# Patient Record
Sex: Female | Born: 1980
Health system: Southern US, Community
[De-identification: ages and names within clinical notes are randomized; demographics above are authoritative.]

## PROBLEM LIST (undated history)

## (undated) DIAGNOSIS — Z8632 Personal history of gestational diabetes: Secondary | ICD-10-CM

## (undated) HISTORY — DX: Personal history of gestational diabetes: Z86.32

---

## 2003-05-29 ENCOUNTER — Other Ambulatory Visit: Admission: RE | Admit: 2003-05-29 | Discharge: 2003-05-29 | Payer: Self-pay | Admitting: Obstetrics and Gynecology

## 2003-09-25 ENCOUNTER — Encounter: Admission: RE | Admit: 2003-09-25 | Discharge: 2003-09-25 | Payer: Self-pay | Admitting: Obstetrics and Gynecology

## 2003-12-12 ENCOUNTER — Inpatient Hospital Stay (HOSPITAL_COMMUNITY): Admission: AD | Admit: 2003-12-12 | Discharge: 2003-12-16 | Payer: Self-pay | Admitting: Obstetrics & Gynecology

## 2013-08-25 ENCOUNTER — Ambulatory Visit: Payer: Self-pay

## 2013-10-10 ENCOUNTER — Ambulatory Visit: Payer: Self-pay | Admitting: Nurse Practitioner

## 2013-11-04 ENCOUNTER — Ambulatory Visit: Payer: Self-pay | Admitting: Nurse Practitioner

## 2013-11-27 ENCOUNTER — Ambulatory Visit: Payer: Self-pay | Admitting: Obstetrics and Gynecology

## 2013-11-27 LAB — CBC WITH DIFFERENTIAL/PLATELET
BASOS ABS: 0 10*3/uL (ref 0.0–0.1)
Basophil %: 0.3 %
EOS ABS: 0.2 10*3/uL (ref 0.0–0.7)
Eosinophil %: 1.5 %
HCT: 40.7 % (ref 35.0–47.0)
HGB: 13.6 g/dL (ref 12.0–16.0)
Lymphocyte #: 1.9 10*3/uL (ref 1.0–3.6)
Lymphocyte %: 17 %
MCH: 31.4 pg (ref 26.0–34.0)
MCHC: 33.5 g/dL (ref 32.0–36.0)
MCV: 94 fL (ref 80–100)
MONO ABS: 0.7 x10 3/mm (ref 0.2–0.9)
MONOS PCT: 6.3 %
NEUTROS ABS: 8.1 10*3/uL — AB (ref 1.4–6.5)
Neutrophil %: 74.9 %
Platelet: 327 10*3/uL (ref 150–440)
RBC: 4.33 10*6/uL (ref 3.80–5.20)
RDW: 13.7 % (ref 11.5–14.5)
WBC: 10.9 10*3/uL (ref 3.6–11.0)

## 2013-11-28 ENCOUNTER — Inpatient Hospital Stay: Payer: Self-pay | Admitting: Obstetrics and Gynecology

## 2013-11-29 LAB — HEMATOCRIT: HCT: 37.7 % (ref 35.0–47.0)

## 2013-12-02 ENCOUNTER — Emergency Department: Payer: Self-pay | Admitting: Emergency Medicine

## 2014-07-28 NOTE — Op Note (Signed)
PATIENT NAME:  Alexandria Peterson, Alexandria Peterson MR#:  [id number] DATE OF BIRTH:  12/16/1980  DATE OF PROCEDURE:  11/28/2013  PROCEDURE: Secondary (Dictation Anomaly) lut  C-section.   PREOPERATIVE DIAGNOSIS: Repeat C-section.  POSTOPERATIVE DIAGNOSIS: Repeat C-section.    ESTIMATED BLOOD LOSS: 1000 mL.   FINDINGS: Term live born infant with normal uterus, tubes and ovaries.   DESCRIPTION OF PROCEDURE: The patient was taken to the operating room and placed in supine position after adequate general spinal anesthesia was instilled. The patient was prepped and draped in the usual sterile fashion. The previous skin incision had been marked with a marking pen. Incision was then extended down to the fascia with a knife, nicked in the midline, and the incision was extended in a superolateral manner. Kochers were placed on the rectus fascia and the fascia and muscles were bluntly and sharply dissected off each other midline. Muscle bellies was identified. The peritoneum was grasped and sharply entered. Bladder blade was placed. A bladder flap was created. Uterine incision was made. The infant's head was delivered. Cord was clamped and cut and the infant was handed to the awaiting pediatrician. Cord blood was obtained. Pitocin was started.  The uterus was exteriorized and wrapped in a moist laparotomy sponge. The interior of the uterus was curetted with a moist lap sponge. Running locked chromic and a running imbricating chromic suture were then performed on the uterine incision and 3-0 chromic was used to tack the bladder back up to the uterine incision. The abdomen was cleared of clots. The uterus was placed back into the abdomen. The gutters were cleared of clots. Muscle bellies were approximated with a running Vicryl suture. On-Q trocars were placed through the skin, subcutaneous fat and fascia. The catheters were then wrapped between the muscle and the fascia. The fascia was closed with a running Vicryl suture. An XLH was  used to approximate the subcutaneous fat. 4-0 Monocryl was placed to approximate the skin edges. Dermabond was placed on the trocar port site. A 4 x 4 and Tegaderm was placed over this. The benzoin and Steri-Strips were placed over the lower skin incision. Clear urine was noted in the Foley bag and the patient was taken to recovery after having tolerated the procedure well.   ____________________________ Elliot Gurneyarrie C. Jin Capote, MD cck:JT D: 11/29/2013 23:41:26 ET T: 11/30/2013 06:05:21 ET JOB#: [id number]  cc: Elliot Gurneyarrie C. Lavar Rosenzweig, MD, <Dictator> Elliot GurneyARRIE C Tzion Wedel MD ELECTRONICALLY SIGNED 12/12/2013 15:06

## 2015-02-04 IMAGING — US US EXTREM LOW VENOUS BILAT
1 series · 13 of 24 positions shown · non-contrast
Comparison: None.

CLINICAL DATA: Lower extremity pain and swelling



[Series 1: us extrem low venous bilat · 0.08mm/px · 13 of 71 slices shown]
[im 1/71]
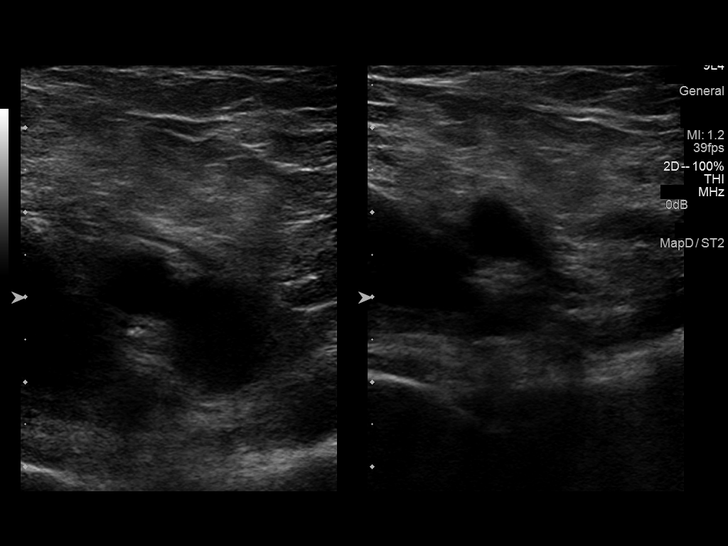
[im 7/71]
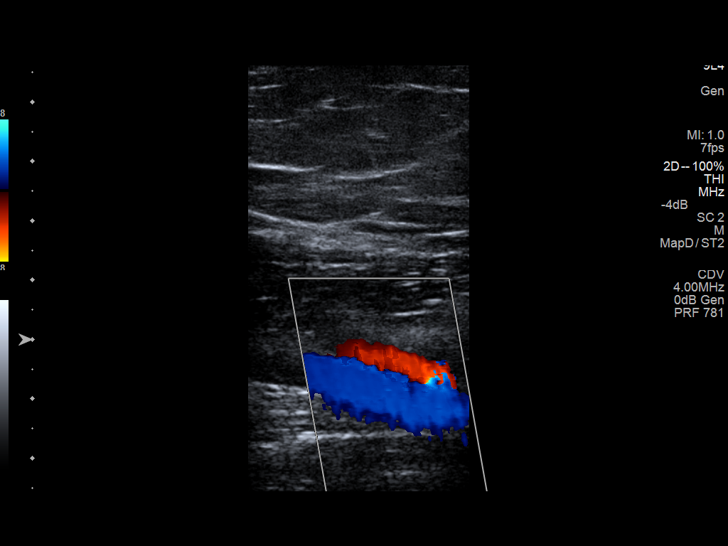
[im 13/71]
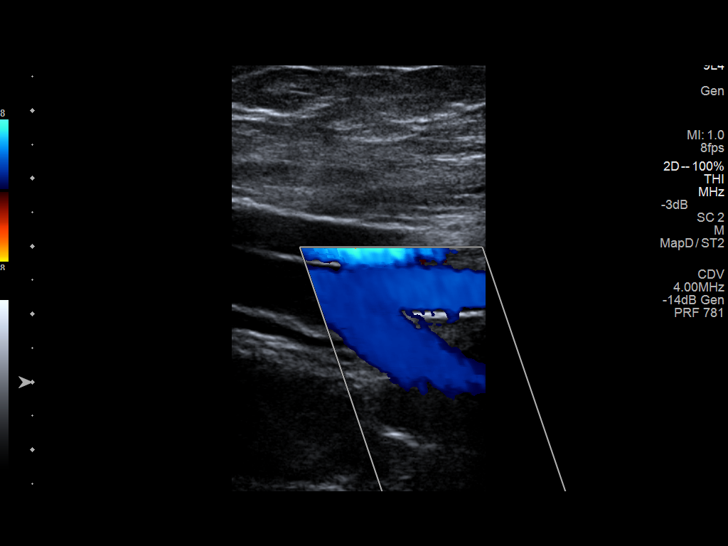
[im 19/71]
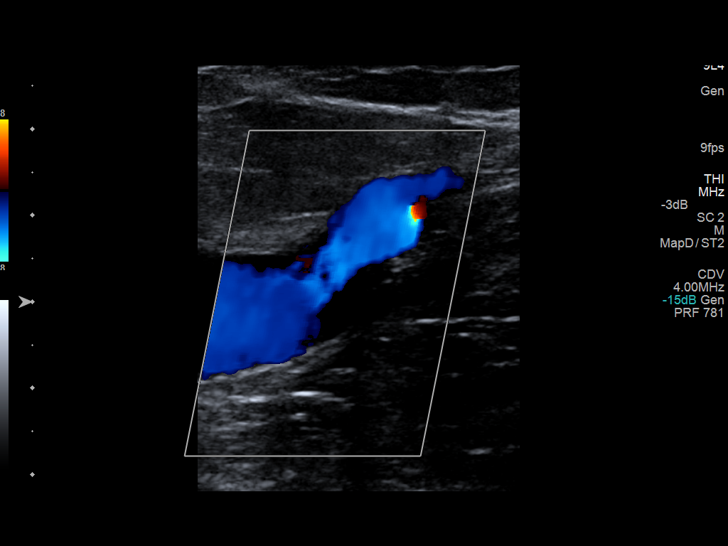
[im 25/71]
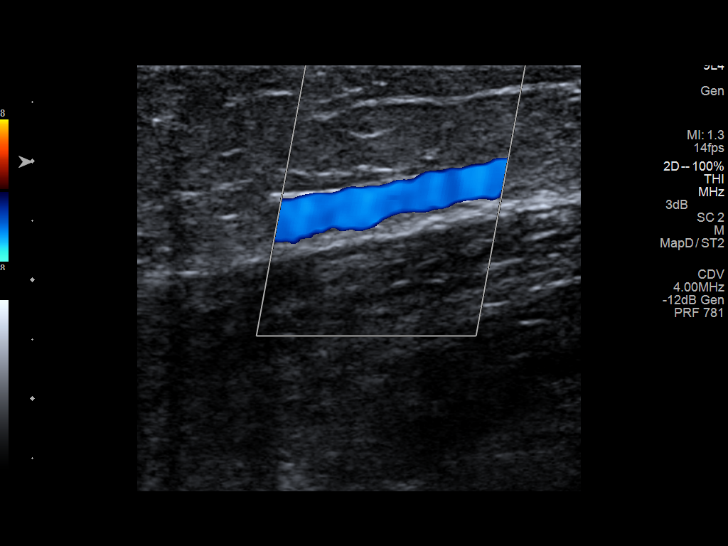
[im 31/71]
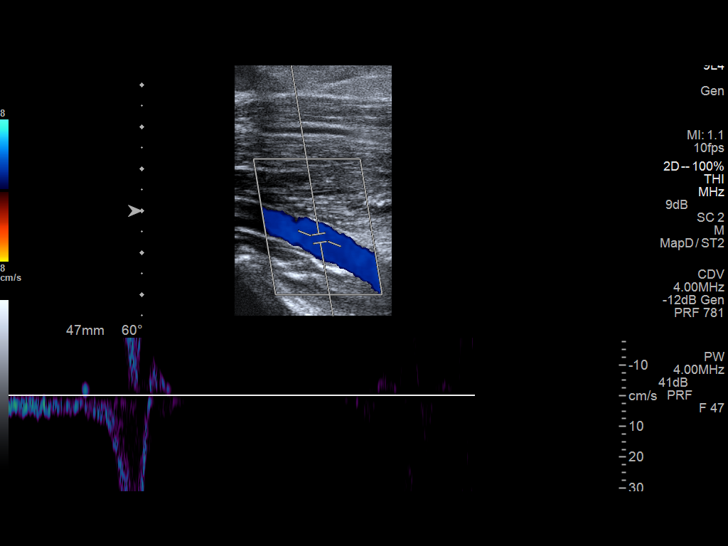
[im 37/71]
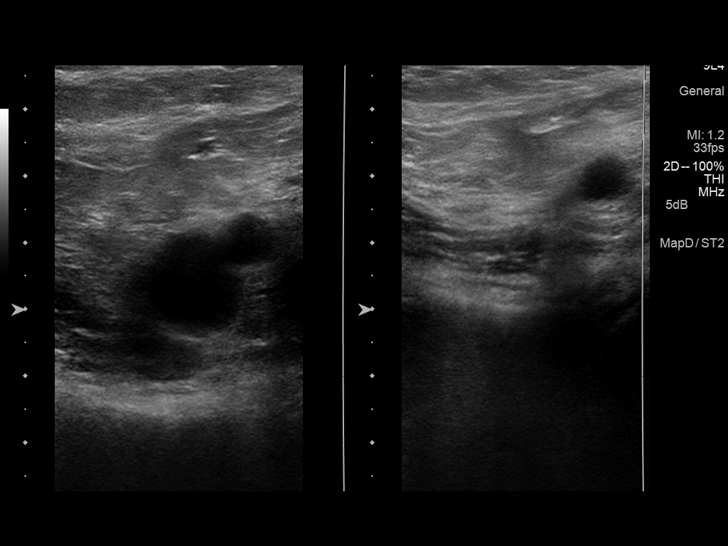
[im 40/71]
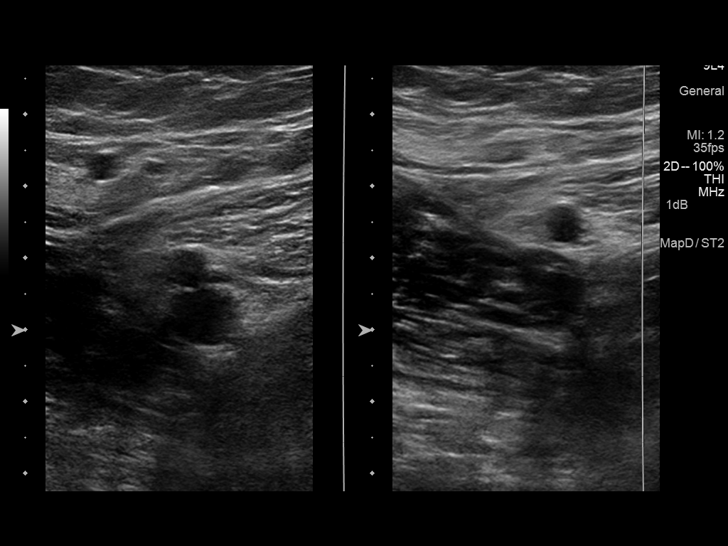
[im 46/71]
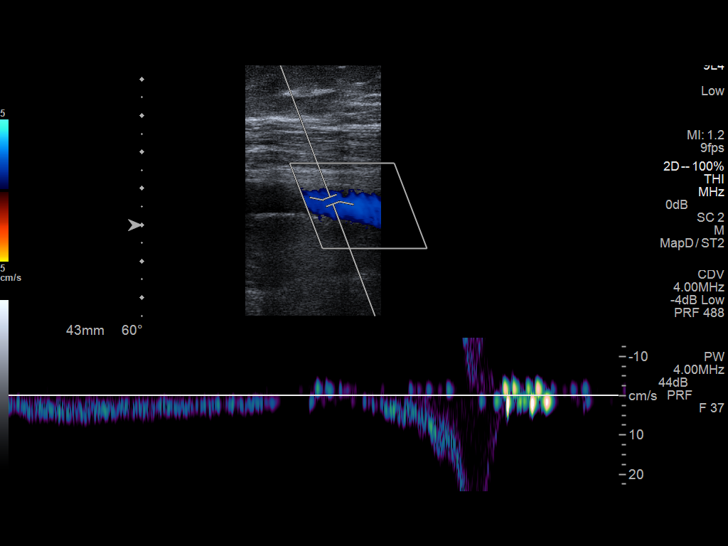
[im 52/71]
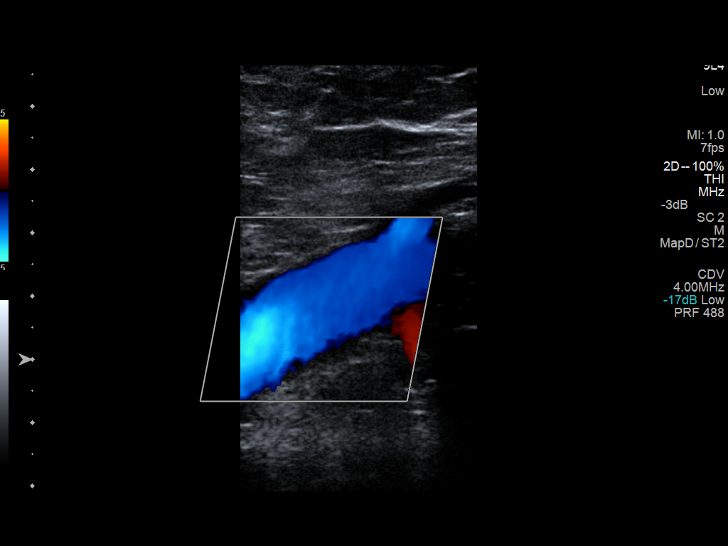
[im 58/71]
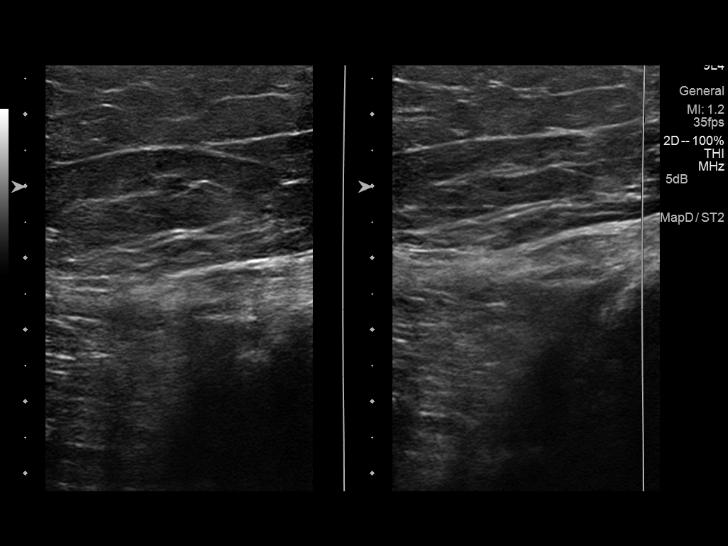
[im 64/71]
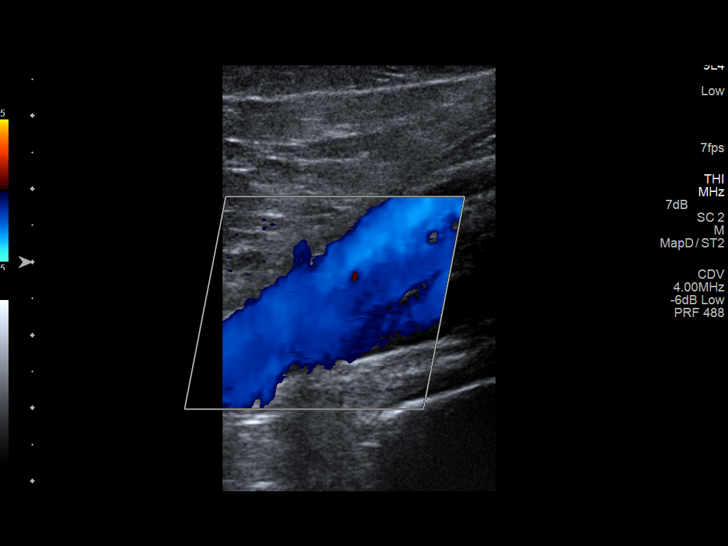
[im 71/71]
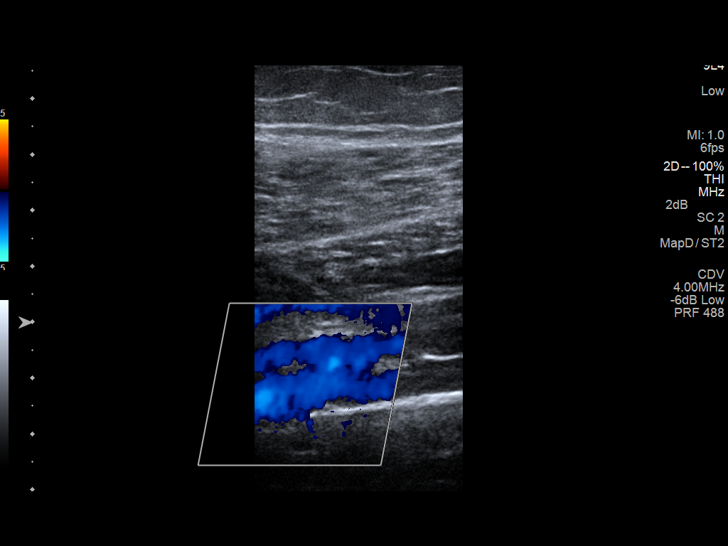

[13 of 24 positions shown; findings below may reference images not displayed]

FINDINGS: RIGHT LOWER EXTREMITY

Common Femoral Vein: No evidence of thrombus. Normal
compressibility, respiratory phasicity and response to augmentation.

Saphenofemoral Junction: No evidence of thrombus. Normal
compressibility and flow on color Doppler imaging.

Profunda Femoral Vein: No evidence of thrombus. Normal
compressibility and flow on color Doppler imaging.

Femoral Vein: No evidence of thrombus. Normal compressibility,
respiratory phasicity and response to augmentation.

Popliteal Vein: No evidence of thrombus. Normal compressibility,
respiratory phasicity and response to augmentation.

Calf Veins: No evidence of thrombus. Normal compressibility and flow
on color Doppler imaging.

Superficial Great Saphenous Vein: No evidence of thrombus. Normal
compressibility and flow on color Doppler imaging.

Venous Reflux:  None.

Other Findings:  None.

LEFT LOWER EXTREMITY

Common Femoral Vein: No evidence of thrombus. Normal
compressibility, respiratory phasicity and response to augmentation.

Saphenofemoral Junction: No evidence of thrombus. Normal
compressibility and flow on color Doppler imaging.

Profunda Femoral Vein: No evidence of thrombus. Normal
compressibility and flow on color Doppler imaging.

Femoral Vein: No evidence of thrombus. Normal compressibility,
respiratory phasicity and response to augmentation.

Popliteal Vein: No evidence of thrombus. Normal compressibility,
respiratory phasicity and response to augmentation.

Calf Veins: No evidence of thrombus. Normal compressibility and flow
on color Doppler imaging.

Superficial Great Saphenous Vein: No evidence of thrombus. Normal
compressibility and flow on color Doppler imaging.

Venous Reflux:  None.

Other Findings:  None.
IMPRESSION: No evidence of deep venous thrombosis.

## 2015-12-16 DIAGNOSIS — Z0100 Encounter for examination of eyes and vision without abnormal findings: Secondary | ICD-10-CM | POA: Diagnosis not present

## 2016-03-23 ENCOUNTER — Ambulatory Visit: Payer: Self-pay | Admitting: Unknown Physician Specialty

## 2016-04-01 DIAGNOSIS — E039 Hypothyroidism, unspecified: Secondary | ICD-10-CM | POA: Insufficient documentation

## 2016-04-01 DIAGNOSIS — I8390 Asymptomatic varicose veins of unspecified lower extremity: Secondary | ICD-10-CM | POA: Insufficient documentation

## 2016-04-01 DIAGNOSIS — F411 Generalized anxiety disorder: Secondary | ICD-10-CM | POA: Insufficient documentation

## 2016-04-03 ENCOUNTER — Ambulatory Visit (INDEPENDENT_AMBULATORY_CARE_PROVIDER_SITE_OTHER): Payer: Self-pay | Admitting: Unknown Physician Specialty

## 2016-04-03 ENCOUNTER — Encounter: Payer: Self-pay | Admitting: Unknown Physician Specialty

## 2016-04-03 VITALS — BP 119/83 | HR 108 | Temp 99.0°F | Ht 64.0 in | Wt 220.0 lb

## 2016-04-03 DIAGNOSIS — E039 Hypothyroidism, unspecified: Secondary | ICD-10-CM

## 2016-04-03 DIAGNOSIS — Z720 Tobacco use: Secondary | ICD-10-CM

## 2016-04-03 DIAGNOSIS — E669 Obesity, unspecified: Secondary | ICD-10-CM

## 2016-04-03 DIAGNOSIS — Z0000 Encounter for general adult medical examination without abnormal findings: Secondary | ICD-10-CM | POA: Diagnosis not present

## 2016-04-03 NOTE — Progress Notes (Signed)
BP 119/83   Pulse (!) 108   Temp 99 F (37.2 C)   Ht 5\' 4"  (1.626 m)   Wt 220 lb (99.8 kg)   LMP 03/20/2016 (Approximate)   SpO2 99%   BMI 37.76 kg/m    Subjective:    Patient ID: Alexandria Peterson, female    DOB: [birth year], 35 y.o.   MRN: [id number]  HPI: Alexandria Peterson is a 35 y.o. female  Chief Complaint  Patient presents with  . Follow-up    just wanted to check in, no concerns   Pt is here to re-establish care and is here for a general check-up.  She has no health concerns but is "ready to quit smoking."  States every time she has tried it doesn't stick.  She quit during her pregnancies.  She smokes 1/2 ppd.  She is planning on doing a quit smoking class.    Hypothyroid History of this.  Not taking medication for about 2 years  Weight She would like to lose weight by changing diet and becoming more active.    Relevant past medical, surgical, family and social history reviewed and updated as indicated. Interim medical history since our last visit reviewed. Allergies and medications reviewed and updated.  Review of Systems  Constitutional: Negative.   HENT: Negative.   Eyes: Negative.   Respiratory: Negative.   Cardiovascular: Negative.   Gastrointestinal: Negative.   Endocrine: Negative.   Genitourinary: Negative.   Musculoskeletal: Negative.   Skin: Negative.   Allergic/Immunologic: Negative.   Neurological: Negative.   Hematological: Negative.   Psychiatric/Behavioral: Negative.     Per HPI unless specifically indicated above     Objective:    BP 119/83   Pulse (!) 108   Temp 99 F (37.2 C)   Ht 5\' 4"  (1.626 m)   Wt 220 lb (99.8 kg)   LMP 03/20/2016 (Approximate)   SpO2 99%   BMI 37.76 kg/m   Wt Readings from Last 3 Encounters:  04/03/16 220 lb (99.8 kg)    Physical Exam  Constitutional: She is oriented to person, place, and time. She appears well-developed and well-nourished. No distress.  HENT:  Head: Normocephalic and atraumatic.   Eyes: Conjunctivae and lids are normal. Right eye exhibits no discharge. Left eye exhibits no discharge. No scleral icterus.  Neck: Normal range of motion. Neck supple. No JVD present. Carotid bruit is not present.  Cardiovascular: Normal rate, regular rhythm and normal heart sounds.   Pulmonary/Chest: Effort normal and breath sounds normal.  Abdominal: Normal appearance. There is no splenomegaly or hepatomegaly.  Musculoskeletal: Normal range of motion.  Neurological: She is alert and oriented to person, place, and time.  Skin: Skin is warm, dry and intact. No rash noted. No pallor.  Psychiatric: She has a normal mood and affect. Her behavior is normal. Judgment and thought content normal.    Results for orders placed or performed in visit on 11/28/13  Hematocrit  Result Value Ref Range   HCT 37.7 35.0 - 47.0 %      Assessment & Plan:   Problem List Items Addressed This Visit      Unprioritized   Hypothyroidism    Check TSHY      Relevant Orders   TSH   Obesity (BMI 35.0-39.9 without comorbidity)    Plans to work on diet with lower carbs.        Relevant Orders   Lipid Panel w/o Chol/HDL Ratio   Tobacco abuse    Resources  to quit given       Other Visit Diagnoses    Routine general medical examination at a health care facility    -  Primary   Relevant Orders   Comprehensive metabolic panel   CBC with Differential/Platelet   Lipid Panel w/o Chol/HDL Ratio       Follow up plan: Return for physical in 6 months.

## 2016-04-03 NOTE — Assessment & Plan Note (Signed)
Plans to work on diet with lower carbs.

## 2016-04-03 NOTE — Assessment & Plan Note (Signed)
Resources to quit given

## 2016-04-03 NOTE — Assessment & Plan Note (Signed)
Check TSHY

## 2016-04-03 NOTE — Patient Instructions (Signed)
Website: [email or web address]

## 2016-04-04 LAB — CBC WITH DIFFERENTIAL/PLATELET
Basophils Absolute: 0.1 10*3/uL (ref 0.0–0.2)
Basos: 1 %
EOS (ABSOLUTE): 0.3 10*3/uL (ref 0.0–0.4)
Eos: 3 %
HEMATOCRIT: 46.3 % (ref 34.0–46.6)
Hemoglobin: 16.1 g/dL — ABNORMAL HIGH (ref 11.1–15.9)
IMMATURE GRANS (ABS): 0 10*3/uL (ref 0.0–0.1)
Immature Granulocytes: 0 %
LYMPHS ABS: 3.2 10*3/uL — AB (ref 0.7–3.1)
LYMPHS: 35 %
MCH: 31.7 pg (ref 26.6–33.0)
MCHC: 34.8 g/dL (ref 31.5–35.7)
MCV: 91 fL (ref 79–97)
Monocytes Absolute: 0.6 10*3/uL (ref 0.1–0.9)
Monocytes: 7 %
NEUTROS ABS: 5 10*3/uL (ref 1.4–7.0)
Neutrophils: 54 %
PLATELETS: 354 10*3/uL (ref 150–379)
RBC: 5.08 x10E6/uL (ref 3.77–5.28)
RDW: 13 % (ref 12.3–15.4)
WBC: 9.2 10*3/uL (ref 3.4–10.8)

## 2016-04-04 LAB — COMPREHENSIVE METABOLIC PANEL
A/G RATIO: 1.5 (ref 1.2–2.2)
ALBUMIN: 4.4 g/dL (ref 3.5–5.5)
ALT: 31 IU/L (ref 0–32)
AST: 28 IU/L (ref 0–40)
Alkaline Phosphatase: 75 IU/L (ref 39–117)
BUN / CREAT RATIO: 17 (ref 9–23)
BUN: 11 mg/dL (ref 6–20)
Bilirubin Total: 0.5 mg/dL (ref 0.0–1.2)
CALCIUM: 9.7 mg/dL (ref 8.7–10.2)
CO2: 25 mmol/L (ref 18–29)
Chloride: 103 mmol/L (ref 96–106)
Creatinine, Ser: 0.66 mg/dL (ref 0.57–1.00)
GFR, EST AFRICAN AMERICAN: 131 mL/min/{1.73_m2} (ref >59)
GFR, EST NON AFRICAN AMERICAN: 114 mL/min/{1.73_m2} (ref >59)
Globulin, Total: 2.9 g/dL (ref 1.5–4.5)
Glucose: 90 mg/dL (ref 65–99)
POTASSIUM: 5.3 mmol/L — AB (ref 3.5–5.2)
Sodium: 142 mmol/L (ref 134–144)
TOTAL PROTEIN: 7.3 g/dL (ref 6.0–8.5)

## 2016-04-04 LAB — LIPID PANEL W/O CHOL/HDL RATIO
Cholesterol, Total: 212 mg/dL — ABNORMAL HIGH (ref 100–199)
HDL: 44 mg/dL (ref >39)
LDL Calculated: 136 mg/dL — ABNORMAL HIGH (ref 0–99)
Triglycerides: 158 mg/dL — ABNORMAL HIGH (ref 0–149)
VLDL Cholesterol Cal: 32 mg/dL (ref 5–40)

## 2016-04-04 LAB — TSH: TSH: 9.68 u[IU]/mL — AB (ref 0.450–4.500)

## 2016-04-07 ENCOUNTER — Telehealth: Payer: Self-pay | Admitting: Unknown Physician Specialty

## 2016-04-07 NOTE — Telephone Encounter (Signed)
Discussed with pt mildly elevated TSH.  Will monitor and recheck in 6 months.

## 2016-08-27 ENCOUNTER — Encounter: Payer: Self-pay | Admitting: Unknown Physician Specialty

## 2016-09-02 ENCOUNTER — Encounter: Payer: Self-pay | Admitting: Unknown Physician Specialty

## 2016-10-02 ENCOUNTER — Encounter: Payer: BLUE CROSS/BLUE SHIELD | Admitting: Unknown Physician Specialty

## 2016-10-13 ENCOUNTER — Encounter: Payer: Self-pay | Admitting: Unknown Physician Specialty

## 2016-11-17 ENCOUNTER — Ambulatory Visit
Admission: RE | Admit: 2016-11-17 | Discharge: 2016-11-17 | Disposition: A | Discharge status: Home / Self Care | Payer: BLUE CROSS/BLUE SHIELD | Source: Ambulatory Visit | Attending: Unknown Physician Specialty | Admitting: Unknown Physician Specialty

## 2016-11-17 ENCOUNTER — Encounter: Payer: Self-pay | Admitting: Unknown Physician Specialty

## 2016-11-17 ENCOUNTER — Ambulatory Visit (INDEPENDENT_AMBULATORY_CARE_PROVIDER_SITE_OTHER): Payer: BLUE CROSS/BLUE SHIELD | Admitting: Unknown Physician Specialty

## 2016-11-17 VITALS — BP 123/82 | HR 87 | Temp 98.5°F | Ht 63.7 in | Wt 215.6 lb

## 2016-11-17 DIAGNOSIS — M25551 Pain in right hip: Secondary | ICD-10-CM | POA: Insufficient documentation

## 2016-11-17 DIAGNOSIS — M25552 Pain in left hip: Secondary | ICD-10-CM | POA: Insufficient documentation

## 2016-11-17 DIAGNOSIS — Z0000 Encounter for general adult medical examination without abnormal findings: Secondary | ICD-10-CM

## 2016-11-17 DIAGNOSIS — Z975 Presence of (intrauterine) contraceptive device: Secondary | ICD-10-CM | POA: Diagnosis not present

## 2016-11-17 DIAGNOSIS — Z23 Encounter for immunization: Secondary | ICD-10-CM | POA: Diagnosis not present

## 2016-11-17 DIAGNOSIS — R102 Pelvic and perineal pain: Secondary | ICD-10-CM | POA: Diagnosis not present

## 2016-11-17 DIAGNOSIS — M25559 Pain in unspecified hip: Secondary | ICD-10-CM | POA: Insufficient documentation

## 2016-11-17 NOTE — Progress Notes (Signed)
BP 123/82   Pulse 87   Temp 98.5 F (36.9 C)   Ht 5' 3.7" (1.618 m)   Wt 215 lb 9.6 oz (97.8 kg)   LMP 10/20/2016 (Approximate)   SpO2 100%   BMI 37.36 kg/m    Subjective:    Patient ID: Alexandria Peterson, female    DOB: [birth year], 36 y.o.   MRN: [id number]  HPI: Alexandria Peterson is a 36 y.o. female  Chief Complaint  Patient presents with  . Annual Exam   Pt states she is having problems with bilateral hip pain, left greater than right.  States this happens after sitting for a while and then trouble when she gets up to walk and eventually improves.  Left hurts to sleep on.  Pain tends to come and go.  Is walking with her husband and thought there was an initial improvement.    [facility]  Decreased Interest 0  Down, Depressed, Hopeless 0  PHQ - 2 Score 0  Altered sleeping 1  Tired, decreased energy 1  Change in appetite 0  Feeling bad or failure about yourself  0  Trouble concentrating 0  Moving slowly or fidgety/restless 0  Suicidal thoughts 0  PHQ-9 Score 2    Relevant past medical, surgical, family and social history reviewed and updated as indicated. Interim medical history since our last visit reviewed. Allergies and medications reviewed and updated.  Review of Systems  Constitutional: Negative.   HENT: Negative.   Eyes: Negative.   Respiratory: Negative.   Cardiovascular: Negative.   Gastrointestinal: Negative.   Endocrine: Negative.   Genitourinary: Negative.   Allergic/Immunologic: Negative.   Neurological: Negative.   Hematological: Negative.   Psychiatric/Behavioral: Negative.     Per HPI unless specifically indicated above     Objective:    BP 123/82   Pulse 87   Temp 98.5 F (36.9 C)   Ht 5' 3.7" (1.618 m)   Wt 215 lb 9.6 oz (97.8 kg)   LMP 10/20/2016 (Approximate)   SpO2 100%   BMI 37.36 kg/m   Wt Readings from Last 3 Encounters:  11/17/16 215 lb 9.6 oz (97.8 kg)  04/03/16 220 lb (99.8 kg)      Physical Exam  Constitutional: She is oriented to person, place, and time. She appears well-developed and well-nourished.  HENT:  Head: Normocephalic and atraumatic.  Eyes: Pupils are equal, round, and reactive to light. Right eye exhibits no discharge. Left eye exhibits no discharge. No scleral icterus.  Neck: Normal range of motion. Neck supple. Carotid bruit is not present. No thyromegaly present.  Cardiovascular: Normal rate, regular rhythm and normal heart sounds.  Exam reveals no gallop and no friction rub.   No murmur heard. Pulmonary/Chest: Effort normal and breath sounds normal. No respiratory distress. She has no wheezes. She has no rales.  Abdominal: Soft. Bowel sounds are normal. There is no tenderness. There is no rebound.  Genitourinary: Vagina normal and uterus normal. No breast swelling, tenderness or discharge. Cervix exhibits no motion tenderness, no discharge and no friability. Right adnexum displays no mass, no tenderness and no fullness. Left adnexum displays no mass, no tenderness and no fullness.  Musculoskeletal: Normal range of motion.  Lymphadenopathy:    She has no cervical adenopathy.  Neurological: She is alert and oriented to person, place, and time.  Skin: Skin is warm, dry and intact. No rash noted.  Psychiatric: She has a normal mood and affect. Her speech is normal and behavior  is normal. Judgment and thought content normal. Cognition and memory are normal.    Results for orders placed or performed in visit on 04/03/16  TSH  Result Value Ref Range   TSH 9.680 (H) 0.450 - 4.500 uIU/mL  Comprehensive metabolic panel  Result Value Ref Range   Glucose 90 65 - 99 mg/dL   BUN 11 6 - 20 mg/dL   Creatinine, Ser 0.98 0.57 - 1.00 mg/dL   GFR calc non Af Amer 114 >59 mL/min/1.73   GFR calc Af Amer 131 >59 mL/min/1.73   BUN/Creatinine Ratio 17 9 - 23   Sodium 142 134 - 144 mmol/L   Potassium 5.3 (H) 3.5 - 5.2 mmol/L   Chloride 103 96 - 106 mmol/L   CO2 25 18  - 29 mmol/L   Calcium 9.7 8.7 - 10.2 mg/dL   Total Protein 7.3 6.0 - 8.5 g/dL   Albumin 4.4 3.5 - 5.5 g/dL   Globulin, Total 2.9 1.5 - 4.5 g/dL   Albumin/Globulin Ratio 1.5 1.2 - 2.2   Bilirubin Total 0.5 0.0 - 1.2 mg/dL   Alkaline Phosphatase 75 39 - 117 IU/L   AST 28 0 - 40 IU/L   ALT 31 0 - 32 IU/L  CBC with Differential/Platelet  Result Value Ref Range   WBC 9.2 3.4 - 10.8 x10E3/uL   RBC 5.08 3.77 - 5.28 x10E6/uL   Hemoglobin 16.1 (H) 11.1 - 15.9 g/dL   Hematocrit 11.9 14.7 - 46.6 %   MCV 91 79 - 97 fL   MCH 31.7 26.6 - 33.0 pg   MCHC 34.8 31.5 - 35.7 g/dL   RDW 82.9 56.2 - 13.0 %   Platelets 354 150 - 379 x10E3/uL   Neutrophils 54 Not Estab. %   Lymphs 35 Not Estab. %   Monocytes 7 Not Estab. %   Eos 3 Not Estab. %   Basos 1 Not Estab. %   Neutrophils Absolute 5.0 1.4 - 7.0 x10E3/uL   Lymphocytes Absolute 3.2 (H) 0.7 - 3.1 x10E3/uL   Monocytes Absolute 0.6 0.1 - 0.9 x10E3/uL   EOS (ABSOLUTE) 0.3 0.0 - 0.4 x10E3/uL   Basophils Absolute 0.1 0.0 - 0.2 x10E3/uL   Immature Granulocytes 0 Not Estab. %   Immature Grans (Abs) 0.0 0.0 - 0.1 x10E3/uL  Lipid Panel w/o Chol/HDL Ratio  Result Value Ref Range   Cholesterol, Total 212 (H) 100 - 199 mg/dL   Triglycerides 865 (H) 0 - 149 mg/dL   HDL 44 >78 mg/dL   VLDL Cholesterol Cal 32 5 - 40 mg/dL   LDL Calculated 469 (H) 0 - 99 mg/dL       Assessment & Plan:   Problem List Items Addressed This Visit      Unprioritized   Hip pain    Will get x-ray.  Continue walking and discussed stretching.  Consider PT      Relevant Orders   DG Pelvis 1-2 Views    Other Visit Diagnoses    Need for Tdap vaccination    -  Primary   Relevant Orders   Tdap vaccine greater than or equal to 7yo IM (Completed)   Annual physical exam       Relevant Orders   CBC with Differential/Platelet   Comprehensive metabolic panel   Lipid Panel w/o Chol/HDL Ratio   TSH   VITAMIN D 25 Hydroxy (Vit-D Deficiency, Fractures)   IUD contraception        Unable to locate strings.  Will refer to  gyn.     Relevant Orders   Ambulatory referral to Gynecology       Follow up plan: Return if symptoms worsen or fail to improve.

## 2016-11-17 NOTE — Patient Instructions (Addendum)
Tdap Vaccine (Tetanus, Diphtheria and Pertussis): What You Need to Know 1. Why get vaccinated? Tetanus, diphtheria and pertussis are very serious diseases. Tdap vaccine can protect us from these diseases. And, Tdap vaccine given to pregnant women can protect newborn babies against pertussis. TETANUS (Lockjaw) is rare in the [place] today. It causes painful muscle tightening and stiffness, usually all over the body.  It can lead to tightening of muscles in the head and neck so you can't open your mouth, swallow, or sometimes even breathe. Tetanus kills about 1 out of 10 people who are infected even after receiving the best medical care.  DIPHTHERIA is also rare in the [place] today. It can cause a thick coating to form in the back of the throat.  It can lead to breathing problems, heart failure, paralysis, and death.  PERTUSSIS (Whooping Cough) causes severe coughing spells, which can cause difficulty breathing, vomiting and disturbed sleep.  It can also lead to weight loss, incontinence, and rib fractures. Up to 2 in 100 adolescents and 5 in 100 adults with pertussis are hospitalized or have complications, which could include pneumonia or death.  These diseases are caused by bacteria. Diphtheria and pertussis are spread from person to person through secretions from coughing or sneezing. Tetanus enters the body through cuts, scratches, or wounds. Before vaccines, as many as 200,000 cases of diphtheria, 200,000 cases of pertussis, and hundreds of cases of tetanus, were reported in the [place] each year. Since vaccination began, reports of cases for tetanus and diphtheria have dropped by about 99% and for pertussis by about 80%. 2. Tdap vaccine Tdap vaccine can protect adolescents and adults from tetanus, diphtheria, and pertussis. One dose of Tdap is routinely given at age 11 or 12. People who did not get Tdap at that age should get it as soon as possible. Tdap is especially  important for healthcare professionals and anyone having close contact with a baby younger than 12 months. Pregnant women should get a dose of Tdap during every pregnancy, to protect the newborn from pertussis. Infants are most at risk for severe, life-threatening complications from pertussis. Another vaccine, called Td, protects against tetanus and diphtheria, but not pertussis. A Td booster should be given every 10 years. Tdap may be given as one of these boosters if you have never gotten Tdap before. Tdap may also be given after a severe cut or burn to prevent tetanus infection. Your doctor or the person giving you the vaccine can give you more information. Tdap may safely be given at the same time as other vaccines. 3. Some people should not get this vaccine  A person who has ever had a life-threatening allergic reaction after a previous dose of any diphtheria, tetanus or pertussis containing vaccine, OR has a severe allergy to any part of this vaccine, should not get Tdap vaccine. Tell the person giving the vaccine about any severe allergies.  Anyone who had coma or long repeated seizures within 7 days after a childhood dose of DTP or DTaP, or a previous dose of Tdap, should not get Tdap, unless a cause other than the vaccine was found. They can still get Td.  Talk to your doctor if you: ? have seizures or another nervous system problem, ? had severe pain or swelling after any vaccine containing diphtheria, tetanus or pertussis, ? ever had a condition called Guillain-Barr Syndrome (GBS), ? aren't feeling well on the day the shot is scheduled. 4. Risks With any medicine, including   vaccines, there is a chance of side effects. These are usually mild and go away on their own. Serious reactions are also possible but are rare. Most people who get Tdap vaccine do not have any problems with it. Mild problems following Tdap: (Did not interfere with activities)  Pain where the shot was given (about  3 in 4 adolescents or 2 in 3 adults)  Redness or swelling where the shot was given (about 1 person in 5)  Mild fever of at least 100.4F (up to about 1 in 25 adolescents or 1 in 100 adults)  Headache (about 3 or 4 people in 10)  Tiredness (about 1 person in 3 or 4)  Nausea, vomiting, diarrhea, stomach ache (up to 1 in 4 adolescents or 1 in 10 adults)  Chills, sore joints (about 1 person in 10)  Body aches (about 1 person in 3 or 4)  Rash, swollen glands (uncommon)  Moderate problems following Tdap: (Interfered with activities, but did not require medical attention)  Pain where the shot was given (up to 1 in 5 or 6)  Redness or swelling where the shot was given (up to about 1 in 16 adolescents or 1 in 12 adults)  Fever over 102F (about 1 in 100 adolescents or 1 in 250 adults)  Headache (about 1 in 7 adolescents or 1 in 10 adults)  Nausea, vomiting, diarrhea, stomach ache (up to 1 or 3 people in 100)  Swelling of the entire arm where the shot was given (up to about 1 in 500).  Severe problems following Tdap: (Unable to perform usual activities; required medical attention)  Swelling, severe pain, bleeding and redness in the arm where the shot was given (rare).  Problems that could happen after any vaccine:  People sometimes faint after a medical procedure, including vaccination. Sitting or lying down for about 15 minutes can help prevent fainting, and injuries caused by a fall. Tell your doctor if you feel dizzy, or have vision changes or ringing in the ears.  Some people get severe pain in the shoulder and have difficulty moving the arm where a shot was given. This happens very rarely.  Any medication can cause a severe allergic reaction. Such reactions from a vaccine are very rare, estimated at fewer than 1 in a million doses, and would happen within a few minutes to a few hours after the vaccination. As with any medicine, there is a very remote chance of a vaccine  causing a serious injury or death. The safety of vaccines is always being monitored. For more information, visit: www.cdc.gov/vaccinesafety/ 5. What if there is a serious problem? What should I look for? Look for anything that concerns you, such as signs of a severe allergic reaction, very high fever, or unusual behavior. Signs of a severe allergic reaction can include hives, swelling of the face and throat, difficulty breathing, a fast heartbeat, dizziness, and weakness. These would usually start a few minutes to a few hours after the vaccination. What should I do?  If you think it is a severe allergic reaction or other emergency that can't wait, call 9-1-1 or get the person to the nearest hospital. Otherwise, call your doctor.  Afterward, the reaction should be reported to the Vaccine Adverse Event Reporting System (VAERS). Your doctor might file this report, or you can do it yourself through the VAERS web site at www.vaers.hhs.gov, or by calling 1-800-822-7967. ? VAERS does not give medical advice. 6. The National Vaccine Injury Compensation Program The National   Vaccine Injury Compensation Program (VICP) is a federal program that was created to compensate people who may have been injured by certain vaccines. Persons who believe they may have been injured by a vaccine can learn about the program and about filing a claim by calling 1-800-338-2382 or visiting the VICP website at www.hrsa.gov/vaccinecompensation. There is a time limit to file a claim for compensation. 7. How can I learn more?  Ask your doctor. He or she can give you the vaccine package insert or suggest other sources of information.  Call your local or state health department.  Contact the Centers for Disease Control and Prevention (CDC): ? Call 1-800-232-4636 (1-800-CDC-INFO) or ? Visit CDC's website at www.cdc.gov/vaccines CDC Tdap Vaccine VIS (05/30/13) This information is not intended to replace advice given to you by your  health care provider. Make sure you discuss any questions you have with your health care provider. Document Released: 09/22/2011 Document Revised: 12/12/2015 Document Reviewed: 12/12/2015 Elsevier Interactive Patient Education  2017 [employer]  

## 2016-11-17 NOTE — Addendum Note (Signed)
Addended by: Gabriel CirriWICKER, Chike Farrington on: 11/17/2016 05:05 PM   Modules accepted: Orders

## 2016-11-17 NOTE — Assessment & Plan Note (Signed)
Will get x-ray.  Continue walking and discussed stretching.  Consider PT

## 2016-11-18 ENCOUNTER — Telehealth: Payer: Self-pay | Admitting: Unknown Physician Specialty

## 2016-11-18 DIAGNOSIS — E039 Hypothyroidism, unspecified: Secondary | ICD-10-CM

## 2016-11-18 LAB — CBC WITH DIFFERENTIAL/PLATELET
BASOS ABS: 0.1 10*3/uL (ref 0.0–0.2)
BASOS: 1 %
EOS (ABSOLUTE): 0.3 10*3/uL (ref 0.0–0.4)
Eos: 3 %
Hematocrit: 43.2 % (ref 34.0–46.6)
Hemoglobin: 14.4 g/dL (ref 11.1–15.9)
IMMATURE GRANS (ABS): 0 10*3/uL (ref 0.0–0.1)
Immature Granulocytes: 0 %
LYMPHS: 26 %
Lymphocytes Absolute: 2.9 10*3/uL (ref 0.7–3.1)
MCH: 31.2 pg (ref 26.6–33.0)
MCHC: 33.3 g/dL (ref 31.5–35.7)
MCV: 94 fL (ref 79–97)
Monocytes Absolute: 0.6 10*3/uL (ref 0.1–0.9)
Monocytes: 6 %
Neutrophils Absolute: 7.2 10*3/uL — ABNORMAL HIGH (ref 1.4–7.0)
Neutrophils: 64 %
Platelets: 323 10*3/uL (ref 150–379)
RBC: 4.62 x10E6/uL (ref 3.77–5.28)
RDW: 13.1 % (ref 12.3–15.4)
WBC: 11.1 10*3/uL — ABNORMAL HIGH (ref 3.4–10.8)

## 2016-11-18 LAB — COMPREHENSIVE METABOLIC PANEL
ALK PHOS: 63 IU/L (ref 39–117)
ALT: 10 IU/L (ref 0–32)
AST: 16 IU/L (ref 0–40)
Albumin/Globulin Ratio: 1.5 (ref 1.2–2.2)
Albumin: 4.4 g/dL (ref 3.5–5.5)
BUN/Creatinine Ratio: 21 (ref 9–23)
BUN: 15 mg/dL (ref 6–20)
Bilirubin Total: 1.1 mg/dL (ref 0.0–1.2)
CHLORIDE: 103 mmol/L (ref 96–106)
CO2: 27 mmol/L (ref 20–29)
Calcium: 9.4 mg/dL (ref 8.7–10.2)
Creatinine, Ser: 0.7 mg/dL (ref 0.57–1.00)
GFR calc Af Amer: 129 mL/min/{1.73_m2} (ref >59)
GFR calc non Af Amer: 112 mL/min/{1.73_m2} (ref >59)
GLUCOSE: 83 mg/dL (ref 65–99)
Globulin, Total: 2.9 g/dL (ref 1.5–4.5)
Potassium: 4.7 mmol/L (ref 3.5–5.2)
Sodium: 140 mmol/L (ref 134–144)
Total Protein: 7.3 g/dL (ref 6.0–8.5)

## 2016-11-18 LAB — VITAMIN D 25 HYDROXY (VIT D DEFICIENCY, FRACTURES): Vit D, 25-Hydroxy: 28.3 ng/mL — ABNORMAL LOW (ref 30.0–100.0)

## 2016-11-18 LAB — LIPID PANEL W/O CHOL/HDL RATIO
CHOLESTEROL TOTAL: 192 mg/dL (ref 100–199)
HDL: 44 mg/dL (ref >39)
LDL CALC: 125 mg/dL — AB (ref 0–99)
TRIGLYCERIDES: 117 mg/dL (ref 0–149)
VLDL CHOLESTEROL CAL: 23 mg/dL (ref 5–40)

## 2016-11-18 LAB — TSH: TSH: 15.57 u[IU]/mL — ABNORMAL HIGH (ref 0.450–4.500)

## 2016-11-18 MED ORDER — LEVOTHYROXINE SODIUM 75 MCG PO TABS: 75.0000 ug | ORAL_TABLET | Freq: Every day | ORAL | 0 refills | Status: AC

## 2016-11-18 NOTE — Telephone Encounter (Signed)
Discussed with pt about low thyroid.  Start Synthroid 75 mcgs.  Recheck levels 3 months

## 2016-11-19 LAB — IGP, APTIMA HPV, RFX 16/18,45
HPV APTIMA: NEGATIVE
PAP SMEAR COMMENT: 0

## 2017-02-02 ENCOUNTER — Encounter: Payer: BLUE CROSS/BLUE SHIELD | Admitting: Obstetrics and Gynecology

## 2018-05-13 ENCOUNTER — Ambulatory Visit (INDEPENDENT_AMBULATORY_CARE_PROVIDER_SITE_OTHER): Payer: BLUE CROSS/BLUE SHIELD | Admitting: Family Medicine

## 2018-05-13 ENCOUNTER — Encounter: Payer: Self-pay | Admitting: Family Medicine

## 2018-05-13 VITALS — BP 135/89 | HR 98 | Temp 98.2°F | Wt 228.6 lb

## 2018-05-13 DIAGNOSIS — N390 Urinary tract infection, site not specified: Secondary | ICD-10-CM | POA: Diagnosis not present

## 2018-05-13 DIAGNOSIS — N760 Acute vaginitis: Secondary | ICD-10-CM

## 2018-05-13 DIAGNOSIS — R350 Frequency of micturition: Secondary | ICD-10-CM | POA: Diagnosis not present

## 2018-05-13 DIAGNOSIS — B9689 Other specified bacterial agents as the cause of diseases classified elsewhere: Secondary | ICD-10-CM

## 2018-05-13 LAB — WET PREP FOR TRICH, YEAST, CLUE
Clue Cell Exam: POSITIVE — AB
Trichomonas Exam: NEGATIVE
Yeast Exam: NEGATIVE

## 2018-05-13 MED ORDER — METRONIDAZOLE 500 MG PO TABS: 500.0000 mg | ORAL_TABLET | Freq: Two times a day (BID) | ORAL | 0 refills | Status: AC

## 2018-05-13 MED ORDER — SULFAMETHOXAZOLE-TRIMETHOPRIM 800-160 MG PO TABS: 1.0000 | ORAL_TABLET | Freq: Two times a day (BID) | ORAL | 0 refills | Status: AC

## 2018-05-13 NOTE — Progress Notes (Signed)
BP 135/89   Pulse 98   Temp 98.2 F (36.8 C) (Oral)   Wt 228 lb 9.6 oz (103.7 kg)   SpO2 100%   BMI 39.61 kg/m    Subjective:    Patient ID: Alexandria Peterson, female    DOB: Nov 17, 1980, 38 y.o.   MRN: [id number]  HPI: Alexandria Peterson is a 38 y.o. female  Chief Complaint  Patient presents with  . Urinary Tract Infection    pt states she has had urinary frequency, burning and pain since Tuesday. States low back pain started yesterday   Here today for 4 days of urinary frequency, dysuria, and lower abdominal pain. Also having some vaginal discharge and irritation. Denies fever, chills, N/V/D. Has been using AZO and cranberry juice, ibuprofen with some temporary relief. Used to have UTIs as a child but hasn't had one in years.   Relevant past medical, surgical, family and social history reviewed and updated as indicated. Interim medical history since our last visit reviewed. Allergies and medications reviewed and updated.  Review of Systems  Per HPI unless specifically indicated above     Objective:    BP 135/89   Pulse 98   Temp 98.2 F (36.8 C) (Oral)   Wt 228 lb 9.6 oz (103.7 kg)   SpO2 100%   BMI 39.61 kg/m   Wt Readings from Last 3 Encounters:  05/13/18 228 lb 9.6 oz (103.7 kg)  11/17/16 215 lb 9.6 oz (97.8 kg)  04/03/16 220 lb (99.8 kg)    Physical Exam Vitals signs and nursing note reviewed.  Constitutional:      Appearance: Normal appearance. She is not ill-appearing.  HENT:     Head: Atraumatic.  Eyes:     Extraocular Movements: Extraocular movements intact.     Conjunctiva/sclera: Conjunctivae normal.  Neck:     Musculoskeletal: Normal range of motion and neck supple.  Cardiovascular:     Rate and Rhythm: Normal rate and regular rhythm.     Heart sounds: Normal heart sounds.  Pulmonary:     Effort: Pulmonary effort is normal.     Breath sounds: Normal breath sounds.  Abdominal:     General: Bowel sounds are normal.     Palpations: Abdomen  is soft.     Tenderness: There is no abdominal tenderness. There is right CVA tenderness (mild) and left CVA tenderness (mild). There is no guarding.  Musculoskeletal: Normal range of motion.  Skin:    General: Skin is warm and dry.  Neurological:     Mental Status: She is alert and oriented to person, place, and time.  Psychiatric:        Mood and Affect: Mood normal.        Thought Content: Thought content normal.        Judgment: Judgment normal.     Results for orders placed or performed in visit on 05/13/18  WET PREP FOR TRICH, YEAST, CLUE  Result Value Ref Range   Trichomonas Exam Negative Negative   Yeast Exam Negative Negative   Clue Cell Exam Positive (A) Negative  Microscopic Examination  Result Value Ref Range   WBC, UA >30 (H) 0 - 5 /hpf   RBC, UA None seen 0 - 2 /hpf   Epithelial Cells (non renal) 0-10 0 - 10 /hpf   Mucus, UA Present (A) Not Estab.   Bacteria, UA Many (A) None seen/Few  Urine Culture, Reflex  Result Value Ref Range   Urine Culture, Routine Final report (  A)    Organism ID, Bacteria Escherichia coli (A)    Antimicrobial Susceptibility Comment   UA/M w/rflx Culture, Routine  Result Value Ref Range   Specific Gravity, UA 1.015 1.005 - 1.030   pH, UA 5.5 5.0 - 7.5   Color, UA Yellow Yellow   Appearance Ur Cloudy (A) Clear   Leukocytes, UA 2+ (A) Negative   Protein, UA 2+ (A) Negative/Trace   Glucose, UA Negative Negative   Ketones, UA Negative Negative   RBC, UA 3+ (A) Negative   Bilirubin, UA Negative Negative   Urobilinogen, Ur 0.2 0.2 - 1.0 mg/dL   Nitrite, UA Negative Negative   Microscopic Examination See below:    Urinalysis Reflex Comment       Assessment & Plan:   Problem List Items Addressed This Visit    None    Visit Diagnoses    Acute lower UTI    -  Primary   Tx with bactrim and await cx. Probiotics, push fluids. Strict return precautions given   Relevant Medications   sulfamethoxazole-trimethoprim (BACTRIM DS,SEPTRA DS)  800-160 MG tablet   metroNIDAZOLE (FLAGYL) 500 MG tablet   Other Relevant Orders   UA/M w/rflx Culture, Routine (Completed)   WET PREP FOR TRICH, YEAST, CLUE (Completed)   BV (bacterial vaginosis)       Wet prep +, tx with flagyl, probiotics, good vaginal hygiene   Relevant Medications   sulfamethoxazole-trimethoprim (BACTRIM DS,SEPTRA DS) 800-160 MG tablet   metroNIDAZOLE (FLAGYL) 500 MG tablet       Follow up plan: Return if symptoms worsen or fail to improve.

## 2018-05-16 LAB — UA/M W/RFLX CULTURE, ROUTINE
BILIRUBIN UA: NEGATIVE
Glucose, UA: NEGATIVE
KETONES UA: NEGATIVE
Nitrite, UA: NEGATIVE
Specific Gravity, UA: 1.015 (ref 1.005–1.030)
UUROB: 0.2 mg/dL (ref 0.2–1.0)
pH, UA: 5.5 (ref 5.0–7.5)

## 2018-05-16 LAB — MICROSCOPIC EXAMINATION
RBC, UA: NONE SEEN /hpf (ref 0–2)
WBC, UA: >30 /hpf — ABNORMAL HIGH (ref 0–5)

## 2018-05-16 LAB — URINE CULTURE, REFLEX

## 2018-10-29 DIAGNOSIS — Z20828 Contact with and (suspected) exposure to other viral communicable diseases: Secondary | ICD-10-CM | POA: Diagnosis not present

## 2019-07-13 ENCOUNTER — Telehealth: Payer: BC Managed Care – PPO | Admitting: Family Medicine

## 2019-07-13 DIAGNOSIS — L299 Pruritus, unspecified: Secondary | ICD-10-CM | POA: Diagnosis not present

## 2019-07-13 DIAGNOSIS — E785 Hyperlipidemia, unspecified: Secondary | ICD-10-CM | POA: Diagnosis not present

## 2019-07-13 DIAGNOSIS — R319 Hematuria, unspecified: Secondary | ICD-10-CM | POA: Diagnosis not present

## 2019-07-13 DIAGNOSIS — B0229 Other postherpetic nervous system involvement: Secondary | ICD-10-CM | POA: Diagnosis not present

## 2019-07-13 DIAGNOSIS — B029 Zoster without complications: Secondary | ICD-10-CM | POA: Diagnosis not present

## 2019-07-14 DIAGNOSIS — R5383 Other fatigue: Secondary | ICD-10-CM | POA: Diagnosis not present

## 2019-07-14 DIAGNOSIS — R7303 Prediabetes: Secondary | ICD-10-CM | POA: Diagnosis not present

## 2019-07-14 DIAGNOSIS — R946 Abnormal results of thyroid function studies: Secondary | ICD-10-CM | POA: Diagnosis not present

## 2019-07-14 DIAGNOSIS — E039 Hypothyroidism, unspecified: Secondary | ICD-10-CM | POA: Diagnosis not present

## 2019-07-14 DIAGNOSIS — E559 Vitamin D deficiency, unspecified: Secondary | ICD-10-CM | POA: Diagnosis not present

## 2019-07-14 DIAGNOSIS — E7800 Pure hypercholesterolemia, unspecified: Secondary | ICD-10-CM | POA: Diagnosis not present

## 2019-07-14 DIAGNOSIS — R5381 Other malaise: Secondary | ICD-10-CM | POA: Diagnosis not present
